# Patient Record
Sex: Female | Born: 1995 | Race: White | Hispanic: No | Marital: Single | Smoking: Never smoker
Health system: Southern US, Community
[De-identification: ages and names within clinical notes are randomized; demographics above are authoritative.]

---

## 2022-05-19 ENCOUNTER — Ambulatory Visit
Admission: EM | Admit: 2022-05-19 | Discharge: 2022-05-19 | Disposition: A | Discharge status: Home / Self Care | Payer: PRIVATE HEALTH INSURANCE | Attending: Nurse Practitioner | Admitting: Nurse Practitioner

## 2022-05-19 ENCOUNTER — Ambulatory Visit (INDEPENDENT_AMBULATORY_CARE_PROVIDER_SITE_OTHER): Payer: PRIVATE HEALTH INSURANCE

## 2022-05-19 ENCOUNTER — Other Ambulatory Visit: Payer: Self-pay

## 2022-05-19 DIAGNOSIS — R109 Unspecified abdominal pain: Secondary | ICD-10-CM

## 2022-05-19 DIAGNOSIS — R079 Chest pain, unspecified: Secondary | ICD-10-CM

## 2022-05-19 DIAGNOSIS — K219 Gastro-esophageal reflux disease without esophagitis: Secondary | ICD-10-CM

## 2022-05-19 DIAGNOSIS — R1032 Left lower quadrant pain: Secondary | ICD-10-CM

## 2022-05-19 LAB — POCT URINALYSIS DIP (MANUAL ENTRY)
Blood, UA: NEGATIVE
Glucose, UA: NEGATIVE mg/dL
Leukocytes, UA: NEGATIVE
Nitrite, UA: NEGATIVE
Spec Grav, UA: >=1.03 — AB (ref 1.010–1.025)
Urobilinogen, UA: 0.2 E.U./dL
pH, UA: 6 (ref 5.0–8.0)

## 2022-05-19 MED ORDER — ALUM & MAG HYDROXIDE-SIMETH 200-200-20 MG/5ML PO SUSP
30.0000 mL | Freq: Once | ORAL | Status: AC
Start: 2022-05-19 — End: 2022-05-19
  Administered 2022-05-19: 30 mL via ORAL

## 2022-05-19 MED ORDER — OMEPRAZOLE 20 MG PO CPDR: 20.0000 mg | DELAYED_RELEASE_CAPSULE | Freq: Every day | ORAL | 0 refills | Status: DC

## 2022-05-19 MED ORDER — LIDOCAINE VISCOUS HCL 2 % MT SOLN
15.0000 mL | Freq: Once | OROMUCOSAL | Status: AC
  Administered 2022-05-19: 15 mL via ORAL

## 2022-05-19 NOTE — Discharge Instructions (Addendum)
Your x-ray urinalysis were negative today. Recommend a watch and wait strategy to see if your symptoms worsen.  In the interim, you can take over-the-counter Tylenol to see if this helps with your abdominal pain. Recommend eating a healthy diet to include increased fruits and vegetables. Follow-up in the ER if you develop fever, chills, worsening abdominal pain, nausea, vomiting, or other concerns.

## 2022-05-19 NOTE — ED Triage Notes (Signed)
Pt states she has been having pain and swelling in her lower left abdominal area for about 3 days  Pt states she is also having some chest discomfort

## 2022-05-19 NOTE — ED Provider Notes (Signed)
[facility]    CSN: [id number] Arrival date & time: 05/19/22  1150      History   Chief Complaint Chief Complaint  Patient presents with   Abdominal Pain    Lower abdominal pain    HPI Lindsay Rogers is a 26 y.o. female.   The patient is a 26 year old female who presents with left lower quadrant abdominal pain and intermittent chest pain.  Symptoms have been present for the past 2 days.  Patient states pain comes and goes, she describes the pain as "sharp" she states the pain stops her in her tracks.  She denies fever, chills, nausea, vomiting, or diarrhea.  With regard to her chest pain, she states that pain has also been present for 2 days. LMP was on 05/02/2022. States that she does have intermittent shortness of breath with the chest pain.  Denies radiation of pain, nausea, vomiting, or diaphoresis. Patient reports history of GERD. Patient also reports she has been drinking energy drinks.    History reviewed. No pertinent past medical history.  There are no problems to display for this patient.   History reviewed. No pertinent surgical history.  OB History   No obstetric history on file.      Home Medications    Prior to Admission medications   Medication Sig Start Date End Date Taking? Authorizing Provider  omeprazole (PRILOSEC) 20 MG capsule Take 1 capsule (20 mg total) by mouth daily. 05/19/22  Yes Shion Bluestein-Warren, Sadie Haber, NP    Family History History reviewed. No pertinent family history.  Social History Social History   Tobacco Use   Smoking status: Never    Passive exposure: Never   Smokeless tobacco: Never  Vaping Use   Vaping Use: Every day   Substances: Nicotine, Flavoring  Substance Use Topics   Alcohol use: Yes   Drug use: Never     Allergies   Patient has no known allergies.   Review of Systems Review of Systems PER HPI  Physical Exam Triage Vital Signs ED Triage Vitals  Enc Vitals Group     BP 05/19/22 1204 114/73      Pulse Rate 05/19/22 1204 98     Resp 05/19/22 1204 20     Temp 05/19/22 1204 98.3 F (36.8 C)     Temp Source 05/19/22 1204 Oral     SpO2 05/19/22 1204 96 %     Weight --      Height --      Head Circumference --      Peak Flow --      Pain Score 05/19/22 1202 9     Pain Loc --      Pain Edu? --      Excl. in GC? --    No data found.  Updated Vital Signs BP 114/73 (BP Location: Right Arm)   Pulse 98   Temp 98.3 F (36.8 C) (Oral)   Resp 20   LMP 05/02/2022 (Approximate)   SpO2 96%   Visual Acuity Right Eye Distance:   Left Eye Distance:   Bilateral Distance:    Right Eye Near:   Left Eye Near:    Bilateral Near:     Physical Exam Vitals reviewed.  Constitutional:      General: She is not in acute distress.    Appearance: She is well-developed.  Cardiovascular:     Rate and Rhythm: Regular rhythm.     Heart sounds: Normal heart sounds.  Pulmonary:  Effort: Pulmonary effort is normal.     Breath sounds: Normal breath sounds.  Abdominal:     General: Bowel sounds are normal. There is no distension.     Palpations: Abdomen is soft.     Tenderness: There is abdominal tenderness in the left lower quadrant. There is no right CVA tenderness, left CVA tenderness, guarding or rebound.     Comments: Reports some relief after GI cocktail.  Genitourinary:    Vagina: Normal. No vaginal discharge.  Skin:    General: Skin is warm and dry.     Capillary Refill: Capillary refill takes less than 2 seconds.     Findings: No erythema or rash.  Neurological:     General: No focal deficit present.     Mental Status: She is alert and oriented to person, place, and time.     Cranial Nerves: No cranial nerve deficit.  Psychiatric:        Mood and Affect: Mood normal.        Behavior: Behavior normal.     UC Treatments / Results  Labs (all labs ordered are listed, but only abnormal results are displayed) Labs Reviewed  POCT URINALYSIS DIP (MANUAL ENTRY) - Abnormal;  Notable for the following components:      Result Value   Bilirubin, UA small (*)    Ketones, POC UA trace (5) (*)    Spec Grav, UA >=1.030 (*)    Protein Ur, POC trace (*)    All other components within normal limits    EKG   Radiology DG Abd 1 View  Result Date: 05/19/2022 CLINICAL DATA:  Abdominal pain EXAM: ABDOMEN - 1 VIEW COMPARISON:  None Available. FINDINGS: The bowel gas pattern is nonobstructive. Mild amount of retained fecal material in the colon. No radio-opaque calculi or other significant radiographic abnormality are seen. IMPRESSION: No acute process identified. Electronically Signed   By: Jannifer Hick M.D.   On: 05/19/2022 12:45    Procedures Procedures (including critical care time)  Medications Ordered in UC Medications  alum & mag hydroxide-simeth (MAALOX/MYLANTA) 200-200-20 MG/5ML suspension 30 mL (30 mLs Oral Given 05/19/22 1232)    And  lidocaine (XYLOCAINE) 2 % viscous mouth solution 15 mL (15 mLs Oral Given 05/19/22 1232)    Initial Impression / Assessment and Plan / UC Course  I have reviewed the triage vital signs and the nursing notes.  Pertinent labs & imaging results that were available during my care of the patient were reviewed by me and considered in my medical decision making (see chart for details).  The patient is a 26 year old female who presents with chest pain and left lower quadrant abdominal pain.  Symptoms have been present for the past 2 days.  Her exam is reassuring, along with her vital signs.  Her EKG is negative for a STEMI.  Her abdominal x-ray is also negative.  Patient advised that I cannot make a definitive determination as the cause of her symptoms based on the limited resources here in urgent care.  Patient was advised that if she has worsening of symptoms to include fever, chills, worsening abdominal pain, with nausea and vomiting, she should go to the ER.  Patient was advised that she can approach with a watch and wait strategy  to see if her symptoms improve.  In the interim, recommended over-the-counter Tylenol to see if this helps with her abdominal pain.  I have also prescribed omeprazole to help with her reflux.  Recommended supportive care  to include increasing fluids and allowing for plenty of rest.  Patient advised to follow-up as needed. Final Clinical Impressions(s) / UC Diagnoses   Final diagnoses:  Left lower quadrant abdominal pain  Gastroesophageal reflux disease, unspecified whether esophagitis present  Chest pain, unspecified type     Discharge Instructions      Your x-ray urinalysis were negative today. Recommend a watch and wait strategy to see if your symptoms worsen.  In the interim, you can take over-the-counter Tylenol to see if this helps with your abdominal pain. Recommend eating a healthy diet to include increased fruits and vegetables. Follow-up in the ER if you develop fever, chills, worsening abdominal pain, nausea, vomiting, or other concerns.     ED Prescriptions     Medication Sig Dispense Auth. Provider   omeprazole (PRILOSEC) 20 MG capsule Take 1 capsule (20 mg total) by mouth daily. 30 capsule Robertta Halfhill-Warren, Sadie Haberhristie J, NP      PDMP not reviewed this encounter.   Abran CantorLeath-Warren, Dewane Timson J, NP 05/19/22 1312

## 2022-07-03 IMAGING — DX DG ABDOMEN 1V
2 series · 2 of 2 positions shown · non-contrast
Comparison: None Available.

CLINICAL DATA: Abdominal pain

EXAM:
ABDOMEN - 1 VIEW

[abdomen supine ap (1 of 2)]
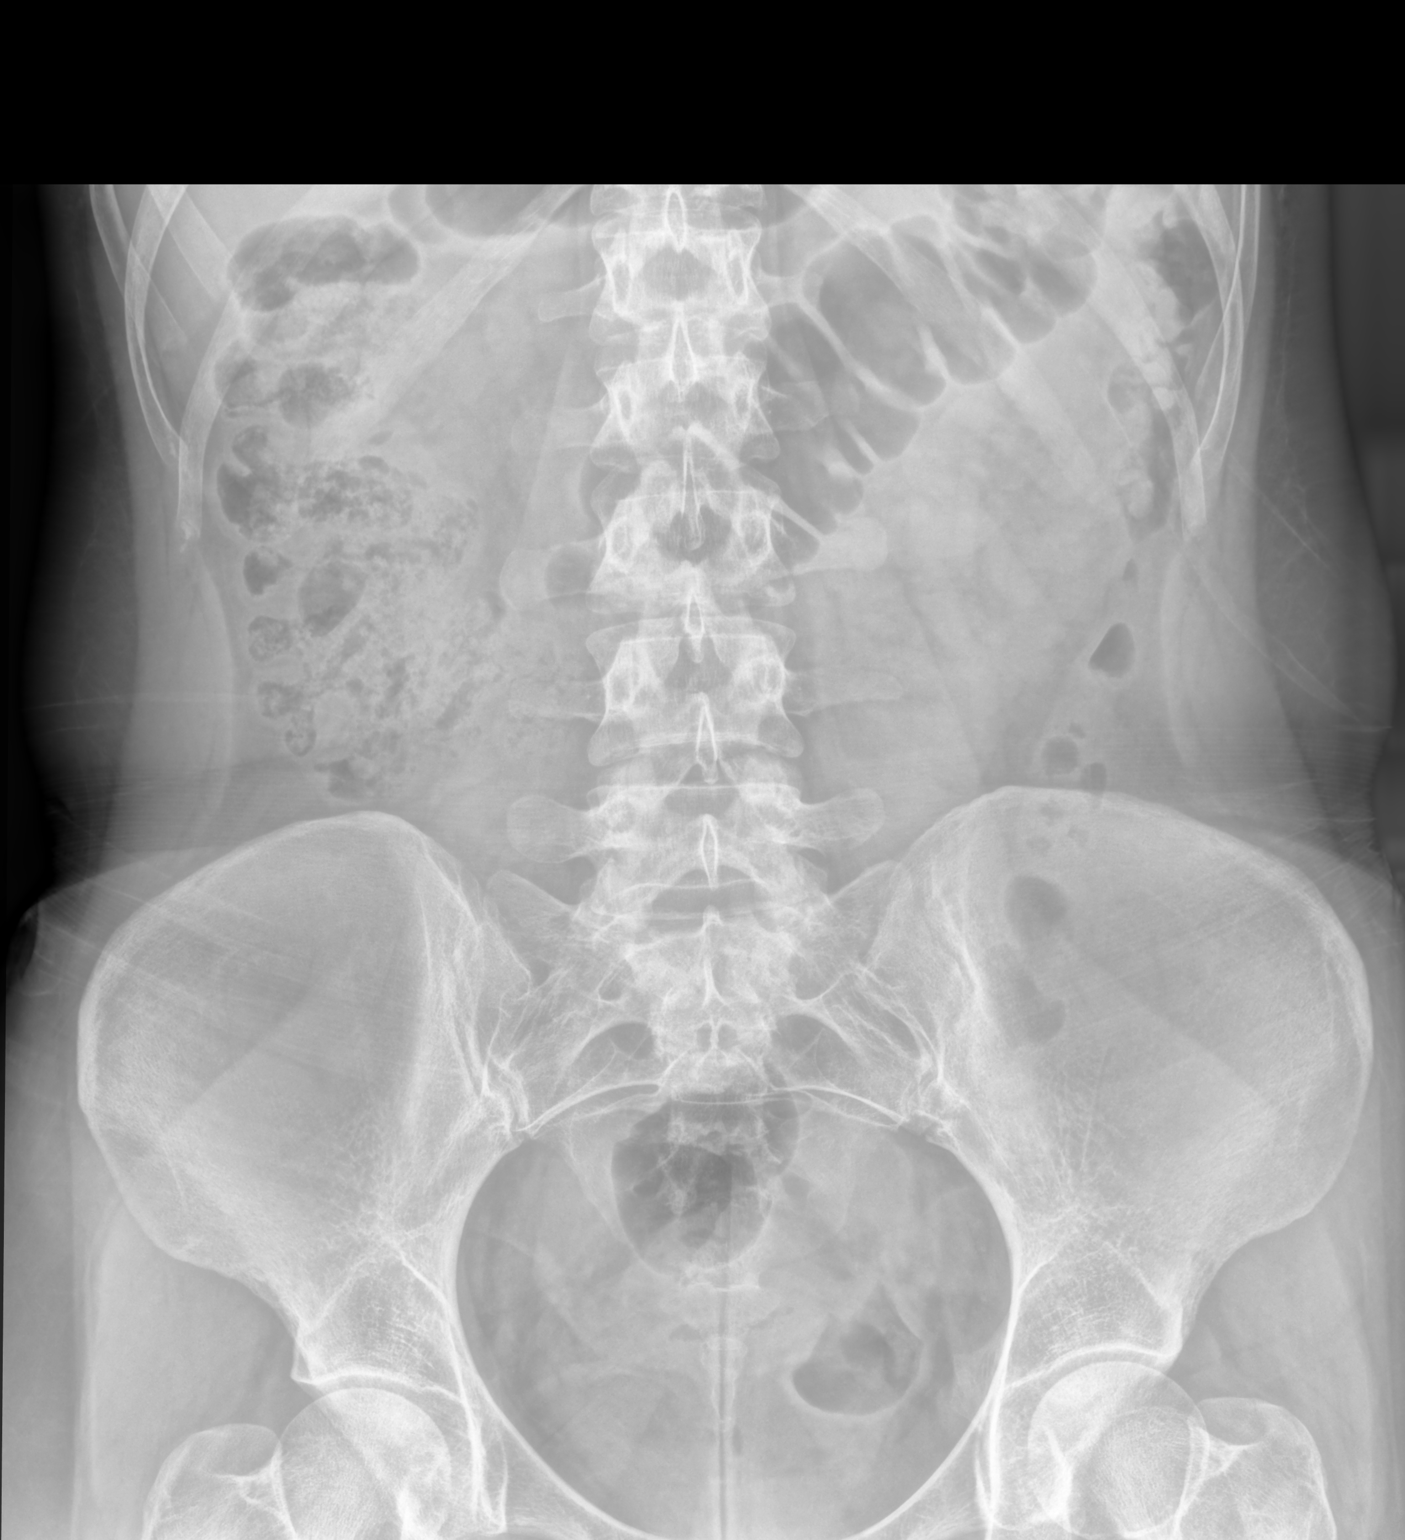

[abdomen supine ap (2 of 2)]
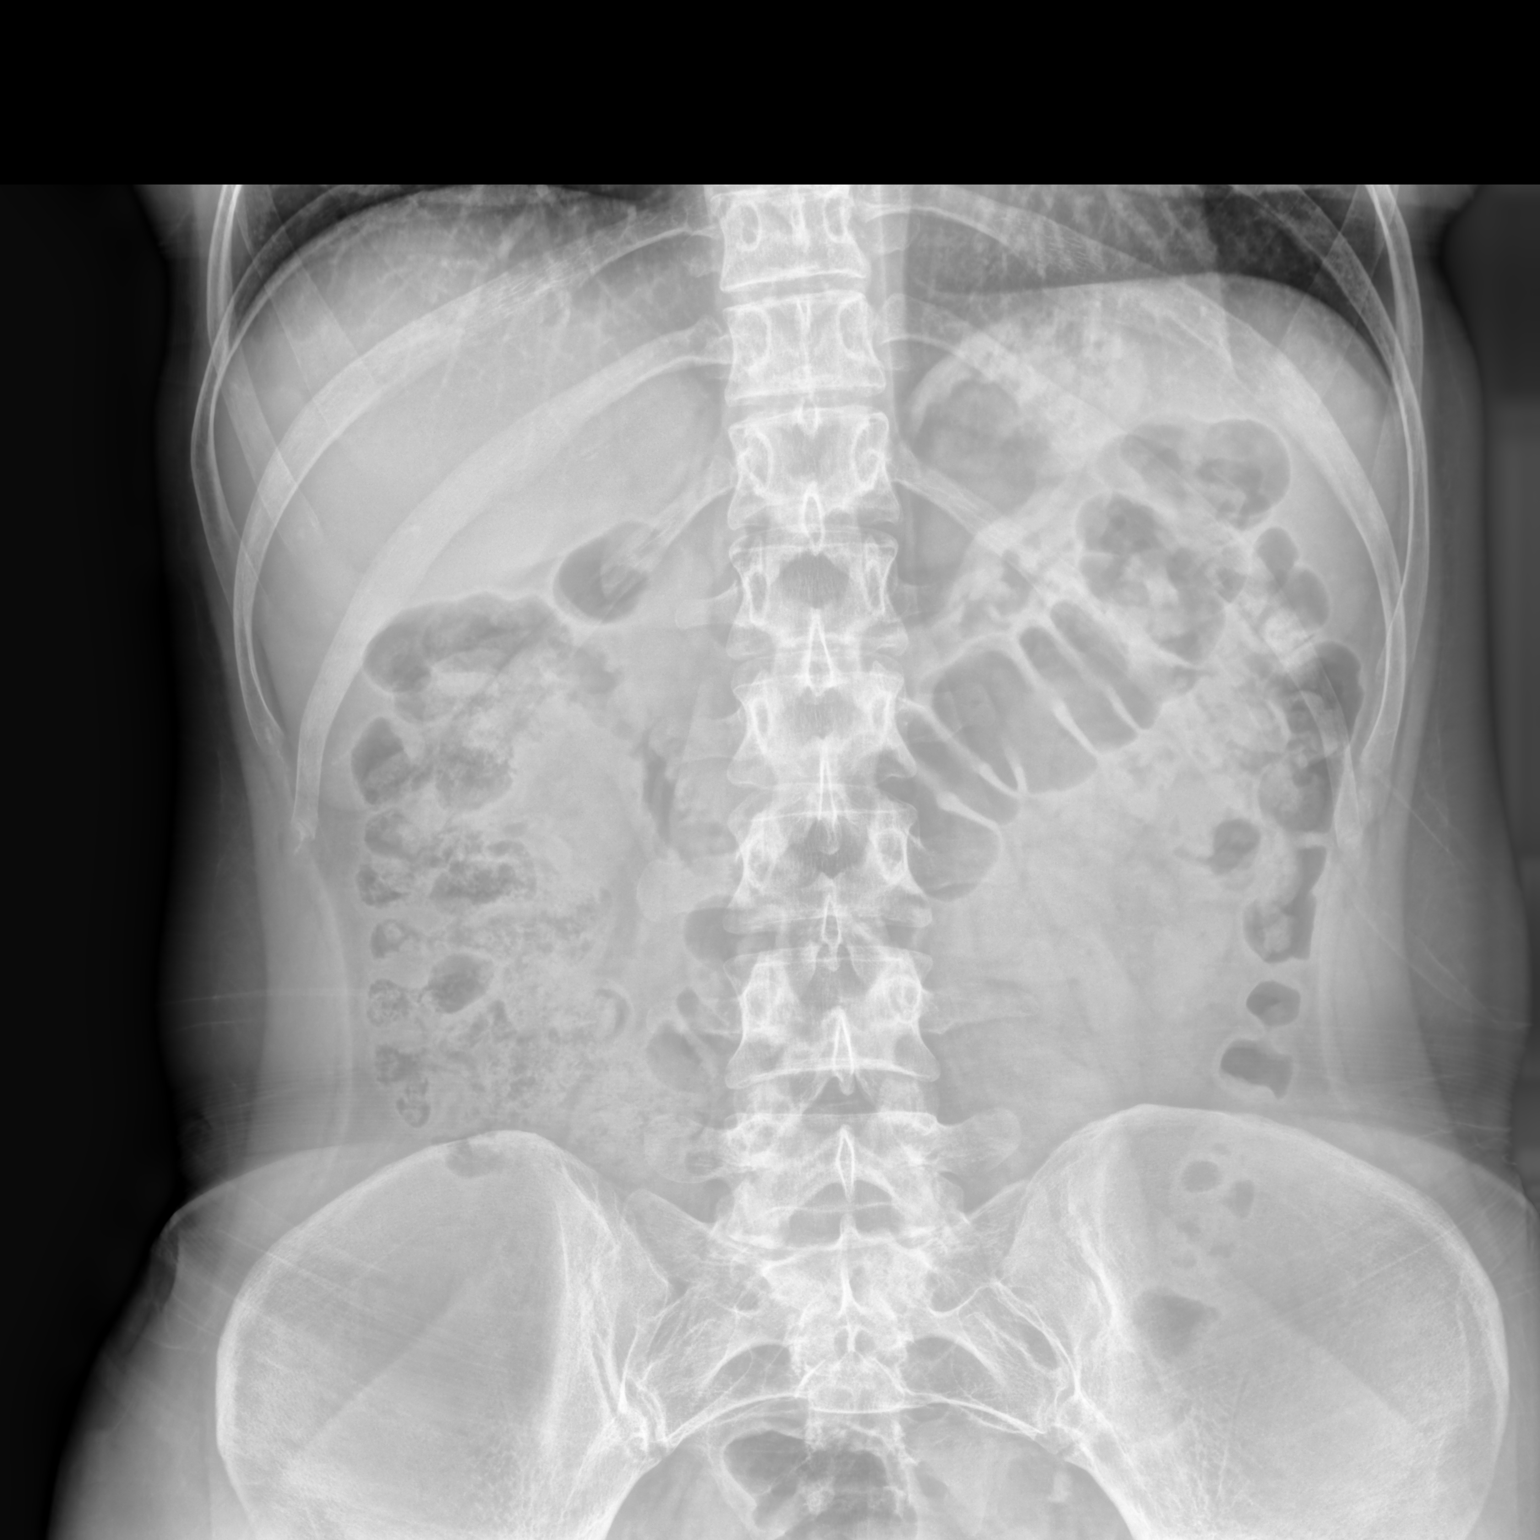

[2 of 2 positions shown; findings below may reference images not displayed]

FINDINGS: The bowel gas pattern is nonobstructive. Mild amount of retained
fecal material in the colon. No radio-opaque calculi or other
significant radiographic abnormality are seen.
IMPRESSION: No acute process identified.

## 2023-12-31 ENCOUNTER — Encounter (HOSPITAL_COMMUNITY): Payer: Self-pay | Admitting: Emergency Medicine

## 2023-12-31 ENCOUNTER — Emergency Department (HOSPITAL_COMMUNITY)
Admission: EM | Admit: 2023-12-31 | Discharge: 2023-12-31 | Disposition: A | Discharge status: Home / Self Care | Payer: No Typology Code available for payment source | Attending: Emergency Medicine | Admitting: Emergency Medicine

## 2023-12-31 ENCOUNTER — Emergency Department (HOSPITAL_COMMUNITY): Payer: No Typology Code available for payment source

## 2023-12-31 ENCOUNTER — Other Ambulatory Visit: Payer: Self-pay

## 2023-12-31 DIAGNOSIS — M25552 Pain in left hip: Secondary | ICD-10-CM | POA: Insufficient documentation

## 2023-12-31 DIAGNOSIS — Z041 Encounter for examination and observation following transport accident: Secondary | ICD-10-CM

## 2023-12-31 DIAGNOSIS — R200 Anesthesia of skin: Secondary | ICD-10-CM | POA: Diagnosis not present

## 2023-12-31 DIAGNOSIS — Y92410 Unspecified street and highway as the place of occurrence of the external cause: Secondary | ICD-10-CM | POA: Insufficient documentation

## 2023-12-31 LAB — URINALYSIS, ROUTINE W REFLEX MICROSCOPIC
Bilirubin Urine: NEGATIVE
Glucose, UA: NEGATIVE mg/dL
Hgb urine dipstick: NEGATIVE
Ketones, ur: NEGATIVE mg/dL
Leukocytes,Ua: NEGATIVE
Nitrite: NEGATIVE
Protein, ur: NEGATIVE mg/dL
Specific Gravity, Urine: 1.01 (ref 1.005–1.030)
pH: 6 (ref 5.0–8.0)

## 2023-12-31 LAB — CBC WITH DIFFERENTIAL/PLATELET
Abs Immature Granulocytes: 0.03 10*3/uL (ref 0.00–0.07)
Basophils Absolute: 0 10*3/uL (ref 0.0–0.1)
Basophils Relative: 0 %
Eosinophils Absolute: 0.1 10*3/uL (ref 0.0–0.5)
Eosinophils Relative: 1 %
HCT: 42.2 % (ref 36.0–46.0)
Hemoglobin: 14.1 g/dL (ref 12.0–15.0)
Immature Granulocytes: 0 %
Lymphocytes Relative: 17 %
Lymphs Abs: 1.5 10*3/uL (ref 0.7–4.0)
MCH: 29.6 pg (ref 26.0–34.0)
MCHC: 33.4 g/dL (ref 30.0–36.0)
MCV: 88.5 fL (ref 80.0–100.0)
Monocytes Absolute: 0.5 10*3/uL (ref 0.1–1.0)
Monocytes Relative: 6 %
Neutro Abs: 6.4 10*3/uL (ref 1.7–7.7)
Neutrophils Relative %: 76 %
Platelets: 426 10*3/uL — ABNORMAL HIGH (ref 150–400)
RBC: 4.77 MIL/uL (ref 3.87–5.11)
RDW: 12 % (ref 11.5–15.5)
WBC: 8.5 10*3/uL (ref 4.0–10.5)
nRBC: 0 % (ref 0.0–0.2)

## 2023-12-31 LAB — TYPE AND SCREEN
ABO/RH(D): A POS
Antibody Screen: NEGATIVE

## 2023-12-31 LAB — COMPREHENSIVE METABOLIC PANEL
ALT: 13 U/L (ref 0–44)
AST: 18 U/L (ref 15–41)
Albumin: 4.6 g/dL (ref 3.5–5.0)
Alkaline Phosphatase: 49 U/L (ref 38–126)
Anion gap: 9 (ref 5–15)
BUN: 8 mg/dL (ref 6–20)
CO2: 21 mmol/L — ABNORMAL LOW (ref 22–32)
Calcium: 9.4 mg/dL (ref 8.9–10.3)
Chloride: 108 mmol/L (ref 98–111)
Creatinine, Ser: 0.66 mg/dL (ref 0.44–1.00)
GFR, Estimated: >60 mL/min (ref >60)
Glucose, Bld: 105 mg/dL — ABNORMAL HIGH (ref 70–99)
Potassium: 4 mmol/L (ref 3.5–5.1)
Sodium: 138 mmol/L (ref 135–145)
Total Bilirubin: 0.8 mg/dL (ref 0.0–1.2)
Total Protein: 7.8 g/dL (ref 6.5–8.1)

## 2023-12-31 LAB — HCG, QUANTITATIVE, PREGNANCY: hCG, Beta Chain, Quant, S: <1 m[IU]/mL (ref <5)

## 2023-12-31 MED ORDER — LIDOCAINE 5 % EX PTCH: 1.0000 | MEDICATED_PATCH | CUTANEOUS | 0 refills | Status: AC

## 2023-12-31 MED ORDER — KETOROLAC TROMETHAMINE 15 MG/ML IJ SOLN
15.0000 mg | Freq: Once | INTRAMUSCULAR | Status: AC
  Administered 2023-12-31: 15 mg via INTRAVENOUS
  Filled 2023-12-31: qty 1

## 2023-12-31 MED ORDER — METHOCARBAMOL 500 MG PO TABS: 500.0000 mg | ORAL_TABLET | Freq: Four times a day (QID) | ORAL | 0 refills | Status: DC | PRN

## 2023-12-31 MED ORDER — FENTANYL CITRATE PF 50 MCG/ML IJ SOSY
50.0000 ug | PREFILLED_SYRINGE | Freq: Once | INTRAMUSCULAR | Status: AC
  Administered 2023-12-31: 50 ug via INTRAVENOUS
  Filled 2023-12-31: qty 1

## 2023-12-31 MED ORDER — IOHEXOL 300 MG/ML  SOLN
100.0000 mL | Freq: Once | INTRAMUSCULAR | Status: AC | PRN
  Administered 2023-12-31: 100 mL via INTRAVENOUS

## 2023-12-31 MED ORDER — LIDOCAINE 5 % EX PTCH
1.0000 | MEDICATED_PATCH | CUTANEOUS | Status: DC
  Administered 2023-12-31: 1 via TRANSDERMAL
  Filled 2023-12-31: qty 1

## 2023-12-31 MED ORDER — NAPROXEN 500 MG PO TABS: 500.0000 mg | ORAL_TABLET | Freq: Two times a day (BID) | ORAL | 0 refills | Status: DC

## 2023-12-31 MED ORDER — KETOROLAC TROMETHAMINE 15 MG/ML IJ SOLN: 15.0000 mg | Freq: Once | INTRAMUSCULAR | Status: DC

## 2023-12-31 MED ORDER — METHOCARBAMOL 500 MG PO TABS
500.0000 mg | ORAL_TABLET | Freq: Once | ORAL | Status: AC
  Administered 2023-12-31: 500 mg via ORAL
  Filled 2023-12-31: qty 1
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
## 2024-01-23 ENCOUNTER — Encounter: Payer: Self-pay | Admitting: Orthopedic Surgery

## 2024-01-23 ENCOUNTER — Ambulatory Visit (INDEPENDENT_AMBULATORY_CARE_PROVIDER_SITE_OTHER): Payer: Self-pay | Admitting: Orthopedic Surgery

## 2024-01-23 ENCOUNTER — Other Ambulatory Visit: Payer: Self-pay

## 2024-01-23 VITALS — Ht 65.0 in | Wt 171.0 lb

## 2024-01-23 DIAGNOSIS — M79605 Pain in left leg: Secondary | ICD-10-CM

## 2024-01-23 DIAGNOSIS — G8929 Other chronic pain: Secondary | ICD-10-CM

## 2024-01-23 DIAGNOSIS — M25562 Pain in left knee: Secondary | ICD-10-CM

## 2024-01-23 MED ORDER — PREDNISONE 10 MG (21) PO TBPK: ORAL_TABLET | ORAL | 0 refills | Status: DC
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
## 2024-01-24 ENCOUNTER — Encounter: Payer: Self-pay | Admitting: Orthopedic Surgery

## 2024-02-06 ENCOUNTER — Encounter: Payer: Self-pay | Admitting: Orthopedic Surgery

## 2024-02-21 ENCOUNTER — Encounter: Payer: Self-pay | Admitting: Orthopedic Surgery

## 2024-02-21 ENCOUNTER — Ambulatory Visit: Payer: Self-pay | Admitting: Orthopedic Surgery

## 2024-02-21 DIAGNOSIS — R200 Anesthesia of skin: Secondary | ICD-10-CM

## 2024-02-21 DIAGNOSIS — M6702 Short Achilles tendon (acquired), left ankle: Secondary | ICD-10-CM

## 2024-02-21 DIAGNOSIS — R202 Paresthesia of skin: Secondary | ICD-10-CM
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
## 2024-03-13 ENCOUNTER — Telehealth: Payer: Self-pay | Admitting: Orthopedic Surgery

## 2024-03-13 NOTE — Telephone Encounter (Signed)
 DR. Dallas Schimke   Patient called and lvm stating we referred her to [facility] and she wants to be referred to [facility] in [place].

## 2024-03-14 NOTE — Telephone Encounter (Signed)
 Information faxed to [facility].

## 2024-04-03 ENCOUNTER — Ambulatory Visit: Payer: Self-pay | Admitting: Orthopedic Surgery

## 2024-05-23 ENCOUNTER — Telehealth: Payer: Self-pay | Admitting: Orthopedic Surgery

## 2024-05-23 NOTE — Telephone Encounter (Signed)
 Returned the pt's call, lvm for her to cb.  She is wanting to schedule an appointment.

## 2024-08-01 ENCOUNTER — Other Ambulatory Visit: Payer: Self-pay

## 2024-08-01 ENCOUNTER — Encounter (HOSPITAL_COMMUNITY): Payer: Self-pay

## 2024-08-01 ENCOUNTER — Emergency Department (HOSPITAL_COMMUNITY): Admitting: Certified Registered Nurse Anesthetist

## 2024-08-01 ENCOUNTER — Encounter (HOSPITAL_COMMUNITY): Admission: EM | Disposition: A | Discharge status: Home / Self Care | Payer: Self-pay | Source: Home / Self Care

## 2024-08-01 ENCOUNTER — Observation Stay (HOSPITAL_COMMUNITY)
Admission: EM | Admit: 2024-08-01 | Discharge: 2024-08-01 | Disposition: A | Discharge status: Home / Self Care | Attending: Surgery | Admitting: Surgery

## 2024-08-01 ENCOUNTER — Emergency Department (HOSPITAL_COMMUNITY)

## 2024-08-01 DIAGNOSIS — F1090 Alcohol use, unspecified, uncomplicated: Secondary | ICD-10-CM | POA: Insufficient documentation

## 2024-08-01 DIAGNOSIS — K353 Acute appendicitis with localized peritonitis, without perforation or gangrene: Secondary | ICD-10-CM | POA: Diagnosis not present

## 2024-08-01 DIAGNOSIS — K3580 Unspecified acute appendicitis: Secondary | ICD-10-CM | POA: Diagnosis not present

## 2024-08-01 DIAGNOSIS — R109 Unspecified abdominal pain: Secondary | ICD-10-CM | POA: Diagnosis present

## 2024-08-01 HISTORY — PX: XI ROBOTIC LAPAROSCOPIC ASSISTED APPENDECTOMY: SHX6877

## 2024-08-01 LAB — CBC WITH DIFFERENTIAL/PLATELET
Abs Immature Granulocytes: 0 K/uL (ref 0.00–0.07)
Basophils Absolute: 0 K/uL (ref 0.0–0.1)
Basophils Relative: 0 %
Eosinophils Absolute: 0.1 K/uL (ref 0.0–0.5)
Eosinophils Relative: 2 %
HCT: 41.8 % (ref 36.0–46.0)
Hemoglobin: 14.2 g/dL (ref 12.0–15.0)
Immature Granulocytes: 0 %
Lymphocytes Relative: 11 %
Lymphs Abs: 0.8 K/uL (ref 0.7–4.0)
MCH: 30.3 pg (ref 26.0–34.0)
MCHC: 34 g/dL (ref 30.0–36.0)
MCV: 89.3 fL (ref 80.0–100.0)
Monocytes Absolute: 0.4 K/uL (ref 0.1–1.0)
Monocytes Relative: 6 %
Neutro Abs: 5.7 K/uL (ref 1.7–7.7)
Neutrophils Relative %: 81 %
Platelets: 257 K/uL (ref 150–400)
RBC: 4.68 MIL/uL (ref 3.87–5.11)
RDW: 12.2 % (ref 11.5–15.5)
WBC: 7 K/uL (ref 4.0–10.5)
nRBC: 0 % (ref 0.0–0.2)

## 2024-08-01 LAB — COMPREHENSIVE METABOLIC PANEL WITH GFR
ALT: 11 U/L (ref 0–44)
AST: 15 U/L (ref 15–41)
Albumin: 4.9 g/dL (ref 3.5–5.0)
Alkaline Phosphatase: 41 U/L (ref 38–126)
Anion gap: 14 (ref 5–15)
BUN: 12 mg/dL (ref 6–20)
CO2: 21 mmol/L — ABNORMAL LOW (ref 22–32)
Calcium: 9.5 mg/dL (ref 8.9–10.3)
Chloride: 102 mmol/L (ref 98–111)
Creatinine, Ser: 0.8 mg/dL (ref 0.44–1.00)
GFR, Estimated: >60 mL/min (ref >60)
Glucose, Bld: 90 mg/dL (ref 70–99)
Potassium: 3.6 mmol/L (ref 3.5–5.1)
Sodium: 137 mmol/L (ref 135–145)
Total Bilirubin: 1.5 mg/dL — ABNORMAL HIGH (ref 0.0–1.2)
Total Protein: 8.1 g/dL (ref 6.5–8.1)

## 2024-08-01 LAB — PREGNANCY, URINE: Preg Test, Ur: NEGATIVE

## 2024-08-01 LAB — URINALYSIS, ROUTINE W REFLEX MICROSCOPIC
Bilirubin Urine: NEGATIVE
Glucose, UA: NEGATIVE mg/dL
Hgb urine dipstick: NEGATIVE
Ketones, ur: NEGATIVE mg/dL
Leukocytes,Ua: NEGATIVE
Nitrite: NEGATIVE
Protein, ur: NEGATIVE mg/dL
Specific Gravity, Urine: 1.014 (ref 1.005–1.030)
pH: 6 (ref 5.0–8.0)

## 2024-08-01 LAB — POC OCCULT BLOOD, ED: Fecal Occult Bld: POSITIVE — AB

## 2024-08-01 LAB — LIPASE, BLOOD: Lipase: 30 U/L (ref 11–51)

## 2024-08-01 SURGERY — APPENDECTOMY, ROBOT-ASSISTED, LAPAROSCOPIC
Anesthesia: General | Site: Abdomen

## 2024-08-01 MED ORDER — CHLORHEXIDINE GLUCONATE 0.12 % MT SOLN
OROMUCOSAL | Status: AC
  Filled 2024-08-01: qty 15

## 2024-08-01 MED ORDER — STERILE WATER FOR IRRIGATION IR SOLN
Status: DC | PRN
  Administered 2024-08-01: 500 mL

## 2024-08-01 MED ORDER — IOHEXOL 300 MG/ML  SOLN
100.0000 mL | Freq: Once | INTRAMUSCULAR | Status: AC | PRN
  Administered 2024-08-01: 100 mL via INTRAVENOUS

## 2024-08-01 MED ORDER — OXYCODONE HCL 5 MG PO TABS: 5.0000 mg | ORAL_TABLET | Freq: Four times a day (QID) | ORAL | 0 refills | Status: AC | PRN

## 2024-08-01 MED ORDER — ONDANSETRON HCL 4 MG/2ML IJ SOLN
INTRAMUSCULAR | Status: DC | PRN
  Administered 2024-08-01: 4 mg via INTRAVENOUS

## 2024-08-01 MED ORDER — PROPOFOL 10 MG/ML IV BOLUS
INTRAVENOUS | Status: DC | PRN
  Administered 2024-08-01: 170 mg via INTRAVENOUS

## 2024-08-01 MED ORDER — DEXAMETHASONE SODIUM PHOSPHATE 10 MG/ML IJ SOLN
INTRAMUSCULAR | Status: AC
Start: 2024-08-01 — End: 2024-08-01
  Filled 2024-08-01: qty 1

## 2024-08-01 MED ORDER — BUPIVACAINE HCL (PF) 0.5 % IJ SOLN
INTRAMUSCULAR | Status: AC
  Filled 2024-08-01: qty 30

## 2024-08-01 MED ORDER — MIDAZOLAM HCL 2 MG/2ML IJ SOLN
INTRAMUSCULAR | Status: AC
  Filled 2024-08-01: qty 2

## 2024-08-01 MED ORDER — METRONIDAZOLE 500 MG/100ML IV SOLN
INTRAVENOUS | Status: AC
  Filled 2024-08-01: qty 100

## 2024-08-01 MED ORDER — CEFAZOLIN SODIUM-DEXTROSE 2-4 GM/100ML-% IV SOLN
2.0000 g | Freq: Once | INTRAVENOUS | Status: AC
  Administered 2024-08-01: 2 g via INTRAVENOUS

## 2024-08-01 MED ORDER — ONDANSETRON 4 MG PO TBDP
4.0000 mg | ORAL_TABLET | Freq: Four times a day (QID) | ORAL | Status: DC | PRN
Start: 2024-08-01 — End: 2024-08-01

## 2024-08-01 MED ORDER — BUPIVACAINE HCL (PF) 0.5 % IJ SOLN
INTRAMUSCULAR | Status: DC | PRN
  Administered 2024-08-01: 30 mL

## 2024-08-01 MED ORDER — FENTANYL CITRATE (PF) 100 MCG/2ML IJ SOLN
INTRAMUSCULAR | Status: DC | PRN
  Administered 2024-08-01 (×3): 50 ug via INTRAVENOUS
  Administered 2024-08-01: 100 ug via INTRAVENOUS

## 2024-08-01 MED ORDER — SUGAMMADEX SODIUM 200 MG/2ML IV SOLN
INTRAVENOUS | Status: AC
  Filled 2024-08-01: qty 2

## 2024-08-01 MED ORDER — ONDANSETRON HCL 4 MG/2ML IJ SOLN
INTRAMUSCULAR | Status: AC
  Filled 2024-08-01: qty 2

## 2024-08-01 MED ORDER — CHLORHEXIDINE GLUCONATE CLOTH 2 % EX PADS: 6.0000 | MEDICATED_PAD | Freq: Once | CUTANEOUS | Status: DC

## 2024-08-01 MED ORDER — SCOPOLAMINE 1 MG/3DAYS TD PT72
1.0000 | MEDICATED_PATCH | Freq: Once | TRANSDERMAL | Status: DC
  Administered 2024-08-01: 1.5 mg via TRANSDERMAL

## 2024-08-01 MED ORDER — ONDANSETRON HCL 4 MG PO TABS: 4.0000 mg | ORAL_TABLET | Freq: Every day | ORAL | 1 refills | Status: AC | PRN

## 2024-08-01 MED ORDER — ONDANSETRON HCL 4 MG/2ML IJ SOLN: 4.0000 mg | Freq: Four times a day (QID) | INTRAMUSCULAR | Status: DC | PRN

## 2024-08-01 MED ORDER — ROCURONIUM BROMIDE 10 MG/ML (PF) SYRINGE
PREFILLED_SYRINGE | INTRAVENOUS | Status: AC
  Filled 2024-08-01: qty 10

## 2024-08-01 MED ORDER — ROCURONIUM BROMIDE 10 MG/ML (PF) SYRINGE
PREFILLED_SYRINGE | INTRAVENOUS | Status: DC | PRN
  Administered 2024-08-01: 60 mg via INTRAVENOUS

## 2024-08-01 MED ORDER — DEXAMETHASONE SODIUM PHOSPHATE 4 MG/ML IJ SOLN: 8.0000 mg | Freq: Once | INTRAMUSCULAR | Status: DC | PRN

## 2024-08-01 MED ORDER — LACTATED RINGERS IV SOLN: INTRAVENOUS | Status: DC

## 2024-08-01 MED ORDER — METRONIDAZOLE 500 MG/100ML IV SOLN: 500.0000 mg | Freq: Once | INTRAVENOUS | Status: DC

## 2024-08-01 MED ORDER — OXYCODONE HCL 5 MG PO TABS
5.0000 mg | ORAL_TABLET | ORAL | Status: DC | PRN
  Administered 2024-08-01: 5 mg via ORAL
  Filled 2024-08-01: qty 2

## 2024-08-01 MED ORDER — FENTANYL CITRATE PF 50 MCG/ML IJ SOSY: 25.0000 ug | PREFILLED_SYRINGE | INTRAMUSCULAR | Status: DC | PRN

## 2024-08-01 MED ORDER — MIDAZOLAM HCL 2 MG/2ML IJ SOLN
INTRAMUSCULAR | Status: DC | PRN
  Administered 2024-08-01: 2 mg via INTRAVENOUS

## 2024-08-01 MED ORDER — SCOPOLAMINE 1 MG/3DAYS TD PT72
MEDICATED_PATCH | TRANSDERMAL | Status: AC
  Filled 2024-08-01: qty 1

## 2024-08-01 MED ORDER — MORPHINE SULFATE (PF) 2 MG/ML IV SOLN: 2.0000 mg | INTRAVENOUS | Status: DC | PRN

## 2024-08-01 MED ORDER — DEXAMETHASONE SODIUM PHOSPHATE 10 MG/ML IJ SOLN
INTRAMUSCULAR | Status: DC | PRN
  Administered 2024-08-01: 10 mg via INTRAVENOUS

## 2024-08-01 MED ORDER — KETOROLAC TROMETHAMINE 30 MG/ML IJ SOLN
INTRAMUSCULAR | Status: DC | PRN
Start: 2024-08-01 — End: 2024-08-01
  Administered 2024-08-01: 30 mg via INTRAVENOUS

## 2024-08-01 MED ORDER — ONDANSETRON HCL 4 MG/2ML IJ SOLN
4.0000 mg | Freq: Once | INTRAMUSCULAR | Status: AC | PRN
  Administered 2024-08-01: 4 mg via INTRAVENOUS
  Filled 2024-08-01: qty 2

## 2024-08-01 MED ORDER — FENTANYL CITRATE (PF) 100 MCG/2ML IJ SOLN
50.0000 ug | Freq: Once | INTRAMUSCULAR | Status: AC
  Administered 2024-08-01: 50 ug via INTRAVENOUS
  Filled 2024-08-01: qty 2

## 2024-08-01 MED ORDER — METRONIDAZOLE 500 MG/100ML IV SOLN
500.0000 mg | Freq: Once | INTRAVENOUS | Status: AC
  Administered 2024-08-01: 500 mg via INTRAVENOUS

## 2024-08-01 MED ORDER — SUGAMMADEX SODIUM 200 MG/2ML IV SOLN
INTRAVENOUS | Status: DC | PRN
  Administered 2024-08-01: 200 mg via INTRAVENOUS

## 2024-08-01 MED ORDER — LIDOCAINE 2% (20 MG/ML) 5 ML SYRINGE
INTRAMUSCULAR | Status: DC | PRN
Start: 2024-08-01 — End: 2024-08-01
  Administered 2024-08-01: 60 mg via INTRAVENOUS

## 2024-08-01 MED ORDER — FENTANYL CITRATE (PF) 250 MCG/5ML IJ SOLN
INTRAMUSCULAR | Status: AC
Start: 2024-08-01 — End: 2024-08-01
  Filled 2024-08-01: qty 5

## 2024-08-01 MED ORDER — PROPOFOL 10 MG/ML IV BOLUS
INTRAVENOUS | Status: AC
  Filled 2024-08-01: qty 20

## 2024-08-01 MED ORDER — SODIUM CHLORIDE 0.9 % IV SOLN: 2.0000 g | Freq: Once | INTRAVENOUS | Status: DC

## 2024-08-01 MED ORDER — MORPHINE SULFATE (PF) 4 MG/ML IV SOLN
4.0000 mg | Freq: Once | INTRAVENOUS | Status: AC
  Administered 2024-08-01: 4 mg via INTRAVENOUS
  Filled 2024-08-01: qty 1

## 2024-08-01 MED ORDER — CEFAZOLIN SODIUM-DEXTROSE 2-4 GM/100ML-% IV SOLN
INTRAVENOUS | Status: AC
  Filled 2024-08-01: qty 100

## 2024-08-01 MED ORDER — KETOROLAC TROMETHAMINE 30 MG/ML IJ SOLN
INTRAMUSCULAR | Status: AC
  Filled 2024-08-01: qty 1

## 2024-08-01 MED ORDER — LIDOCAINE 2% (20 MG/ML) 5 ML SYRINGE
INTRAMUSCULAR | Status: AC
  Filled 2024-08-01: qty 5

## 2024-08-01 SURGICAL SUPPLY — 36 items
COVER LIGHT HANDLE (MISCELLANEOUS) IMPLANT
COVER MAYO STAND XLG (MISCELLANEOUS) ×1 IMPLANT
DEFOGGER SCOPE WARMER CLEARIFY (MISCELLANEOUS) ×1 IMPLANT
DERMABOND ADVANCED .7 DNX12 (GAUZE/BANDAGES/DRESSINGS) ×1 IMPLANT
DRAPE ARM DVNC X/XI (DISPOSABLE) ×3 IMPLANT
DRAPE COLUMN DVNC XI (DISPOSABLE) ×1 IMPLANT
FORCEPS BPLR R/ABLATION 8 DVNC (INSTRUMENTS) ×1 IMPLANT
GLOVE BIOGEL PI IND STRL 6.5 (GLOVE) ×2 IMPLANT
GLOVE BIOGEL PI IND STRL 7.0 (GLOVE) ×3 IMPLANT
GLOVE SURG SS PI 6.5 STRL IVOR (GLOVE) ×2 IMPLANT
GOWN STRL REUS W/TWL LRG LVL3 (GOWN DISPOSABLE) ×3 IMPLANT
GRASPER SUT TROCAR 14GX15 (MISCELLANEOUS) ×1 IMPLANT
KIT PINK PAD W/HEAD ARM REST (MISCELLANEOUS) ×1 IMPLANT
KIT TURNOVER KIT A (KITS) ×1 IMPLANT
MANIFOLD NEPTUNE II (INSTRUMENTS) ×1 IMPLANT
NDL HYPO 21X1.5 SAFETY (NEEDLE) ×1 IMPLANT
NDL INSUFFLATION 14GA 120MM (NEEDLE) ×1 IMPLANT
NEEDLE HYPO 21X1.5 SAFETY (NEEDLE) ×1 IMPLANT
NEEDLE INSUFFLATION 14GA 120MM (NEEDLE) ×1 IMPLANT
OBTURATOR OPTICALSTD 8 DVNC (TROCAR) ×1 IMPLANT
PACK LAP CHOLE LZT030E (CUSTOM PROCEDURE TRAY) ×1 IMPLANT
PENCIL HANDSWITCHING (ELECTRODE) ×1 IMPLANT
RELOAD STAPLE 30 2.5 WHT DVNC (STAPLE) ×1 IMPLANT
SEAL UNIV 5-12 XI (MISCELLANEOUS) ×4 IMPLANT
SEALER VESSEL EXT DVNC XI (MISCELLANEOUS) ×1 IMPLANT
SET BASIN LINEN APH (SET/KITS/TRAYS/PACK) ×1 IMPLANT
SET TUBE SMOKE EVAC HIGH FLOW (TUBING) ×1 IMPLANT
STAPLER 30 CRVD 8 SUREFORM (STAPLE) ×1 IMPLANT
SUT MNCRL AB 4-0 PS2 18 (SUTURE) ×1 IMPLANT
SUT SILK 2-0 18XBRD TIE 12 (SUTURE) ×1 IMPLANT
SUT VICRYL 0 UR6 27IN ABS (SUTURE) ×1 IMPLANT
SYR 30ML LL (SYRINGE) ×1 IMPLANT
SYSTEM RETRIEVL 5MM INZII UNIV (BASKET) ×1 IMPLANT
TAPE TRANSPORE STRL 2 31045 (GAUZE/BANDAGES/DRESSINGS) ×1 IMPLANT
TRAY FOLEY W/BAG SLVR 16FR ST (SET/KITS/TRAYS/PACK) ×1 IMPLANT
WATER STERILE IRR 500ML POUR (IV SOLUTION) ×1 IMPLANT

## 2024-08-01 NOTE — Progress Notes (Signed)
 [facility]  Spoke with the patient's family in the consultation room.  I explained that she tolerated the procedure without difficulty.  She has dissolvable stitches under the skin with overlying skin glue.  This will flake off in 10 to 14 days.  We will see how she does postoperatively.  If she tolerates a diet without nausea and vomiting, and pain is well-controlled, she will be stable for discharge home today.  Otherwise we will plan for discharge home tomorrow.  At the time of discharge, she will be discharged home with a prescription for narcotic pain medication that they should take as needed for pain.  I also want her taking scheduled Tylenol.  If they take the narcotic pain medication, they should take a stool softener as well.  The patient will follow-up with me in 2 weeks for phone follow-up.  All questions were answered to their expressed satisfaction.  Plan: -Place in observation in a MedSurg bed -Regular diet -PRN pain control and antiemetics -No need for antibiotics postoperatively -Will see how patient is doing later today.  If she tolerates a diet without nausea and vomiting, and her pain is well-controlled, will plan for discharge home today.  Otherwise, we will plan for discharge home tomorrow -CBC tomorrow AM if patient still in hospital -SCDs ordered  Dorothyann Brittle, DO [facility] [address] [address] [place], [place] [address] [phone] (office)

## 2024-08-01 NOTE — Anesthesia Preprocedure Evaluation (Addendum)
 Anesthesia Evaluation  Patient identified by MRN, date of birth, ID band Patient awake    Reviewed: Allergy & Precautions, H&P , NPO status , Patient's Chart, lab work & pertinent test results  Airway Mallampati: I  TM Distance: >3 FB Neck ROM: Full    Dental no notable dental hx.    Pulmonary neg pulmonary ROS   Pulmonary exam normal breath sounds clear to auscultation       Cardiovascular negative cardio ROS Normal cardiovascular exam Rhythm:Regular Rate:Normal     Neuro/Psych negative neurological ROS  negative psych ROS   GI/Hepatic negative GI ROS, Neg liver ROS,,,  Endo/Other  negative endocrine ROS    Renal/GU negative Renal ROS  negative genitourinary   Musculoskeletal negative musculoskeletal ROS (+)    Abdominal   Peds negative pediatric ROS (+)  Hematology negative hematology ROS (+)   Anesthesia Other Findings   Reproductive/Obstetrics negative OB ROS                              Anesthesia Physical Anesthesia Plan  ASA: 1 and emergent  Anesthesia Plan: General   Post-op Pain Management:    Induction: Rapid sequence and Intravenous  PONV Risk Score and Plan: Scopolamine  patch - Pre-op  Airway Management Planned: Oral ETT  Additional Equipment:   Intra-op Plan:   Post-operative Plan: Extubation in OR  Informed Consent: I have reviewed the patients History and Physical, chart, labs and discussed the procedure including the risks, benefits and alternatives for the proposed anesthesia with the patient or authorized representative who has indicated his/her understanding and acceptance.     Dental advisory given  Plan Discussed with: CRNA  Anesthesia Plan Comments:         Anesthesia Quick Evaluation

## 2024-08-01 NOTE — H&P (Signed)
 [facility] History and Physical  Reason for Referral: Early acute appendicitis Referring Physician: Sherran Barks, PA-C  Chief Complaint   Abdominal Pain     Lindsay Rogers is a 28 y.o. female.  HPI: Patient presents for a 1 day history of periumbilical pain that has since radiated into her right lower quadrant.  She states that starting yesterday she had periumbilical pain that was very severe and sharp.  It started moving into her right lower quadrant last night, and has failed to improve this morning.  She had nausea without any associated emesis.  She also notes that she had some bloody bowel movements this morning.  She has no significant past medical history and is not currently taking any medications.  She denies history of abdominal surgeries.  In the ED, she was noted to be hemodynamically stable.  Her blood work was within normal limits.  She underwent a CT of the abdomen and pelvis which demonstrated concern for possible early acute appendicitis.  History reviewed. No pertinent past medical history.  History reviewed. No pertinent surgical history.  History reviewed. No pertinent family history.  Social History   Tobacco Use   Smoking status: Never    Passive exposure: Never   Smokeless tobacco: Never  Vaping Use   Vaping status: Every Day   Substances: Nicotine, Flavoring  Substance Use Topics   Alcohol use: Yes   Drug use: Never    Medications: I have reviewed the patient's current medications. Allergies as of 08/01/2024       Reactions   Red Dye Shortness Of Breath, Swelling, Other (See Comments)   Difficulty breathing      ROS:  Pertinent items are noted in HPI.  Blood pressure 115/71, pulse 72, temperature 98.1 F (36.7 C), temperature source Oral, resp. rate 18, height 5' 5 (1.651 m), weight 73.5 kg, last menstrual period 07/24/2024, SpO2 97%. Physical Exam Vitals reviewed.  Constitutional:      Appearance: She is well-developed.   HENT:     Head: Normocephalic and atraumatic.  Eyes:     Extraocular Movements: Extraocular movements intact.     Pupils: Pupils are equal, round, and reactive to light.  Cardiovascular:     Rate and Rhythm: Normal rate.  Pulmonary:     Effort: Pulmonary effort is normal.  Abdominal:     Comments: Abdomen soft, nondistended, no percussion tenderness, tenderness to palpation in the right lower quadrant; no rigidity, guarding, rebound tenderness; positive McBurney sign  Skin:    General: Skin is warm and dry.  Neurological:     General: No focal deficit present.     Mental Status: She is alert and oriented to person, place, and time.  Psychiatric:        Mood and Affect: Mood normal.        Behavior: Behavior normal.     Results: Results for orders placed or performed during the hospital encounter of 08/01/24 (from the past 48 hours)  Lipase, blood     Status: None   Collection Time: 08/01/24  9:20 AM  Result Value Ref Range   Lipase 30 11 - 51 U/L    Comment: Performed at [facility], [address], [place], [place] [address]  Comprehensive metabolic panel     Status: Abnormal   Collection Time: 08/01/24  9:20 AM  Result Value Ref Range   Sodium 137 135 - 145 mmol/L   Potassium 3.6 3.5 - 5.1 mmol/L   Chloride 102 98 -  111 mmol/L   CO2 21 (L) 22 - 32 mmol/L   Glucose, Bld 90 70 - 99 mg/dL    Comment: Glucose reference range applies only to samples taken after fasting for at least 8 hours.   BUN 12 6 - 20 mg/dL   Creatinine, Ser 9.19 0.44 - 1.00 mg/dL   Calcium 9.5 8.9 - 89.6 mg/dL   Total Protein 8.1 6.5 - 8.1 g/dL   Albumin 4.9 3.5 - 5.0 g/dL   AST 15 15 - 41 U/L   ALT 11 0 - 44 U/L   Alkaline Phosphatase 41 38 - 126 U/L   Total Bilirubin 1.5 (H) 0.0 - 1.2 mg/dL   GFR, Estimated >39 >39 mL/min    Comment: (NOTE) Calculated using the CKD-EPI Creatinine Equation (2021)    Anion gap 14 5 - 15    Comment: Performed at [facility], [address] [place], [place]  [address]  Urinalysis, Routine w reflex microscopic -Urine, Clean Catch     Status: None   Collection Time: 08/01/24  9:20 AM  Result Value Ref Range   Color, Urine YELLOW YELLOW   APPearance CLEAR CLEAR   Specific Gravity, Urine 1.014 1.005 - 1.030   pH 6.0 5.0 - 8.0   Glucose, UA NEGATIVE NEGATIVE mg/dL   Hgb urine dipstick NEGATIVE NEGATIVE   Bilirubin Urine NEGATIVE NEGATIVE   Ketones, ur NEGATIVE NEGATIVE mg/dL   Protein, ur NEGATIVE NEGATIVE mg/dL   Nitrite NEGATIVE NEGATIVE   Leukocytes,Ua NEGATIVE NEGATIVE    Comment: Performed at [facility], [address] [place], [place] [address]  CBC with Differential     Status: None   Collection Time: 08/01/24  9:20 AM  Result Value Ref Range   WBC 7.0 4.0 - 10.5 K/uL   RBC 4.68 3.87 - 5.11 MIL/uL   Hemoglobin 14.2 12.0 - 15.0 g/dL   HCT 58.1 63.9 - 53.9 %   MCV 89.3 80.0 - 100.0 fL   MCH 30.3 26.0 - 34.0 pg   MCHC 34.0 30.0 - 36.0 g/dL   RDW 87.7 88.4 - 84.4 %   Platelets 257 150 - 400 K/uL   nRBC 0.0 0.0 - 0.2 %   Neutrophils Relative % 81 %   Neutro Abs 5.7 1.7 - 7.7 K/uL   Lymphocytes Relative 11 %   Lymphs Abs 0.8 0.7 - 4.0 K/uL   Monocytes Relative 6 %   Monocytes Absolute 0.4 0.1 - 1.0 K/uL   Eosinophils Relative 2 %   Eosinophils Absolute 0.1 0.0 - 0.5 K/uL   Basophils Relative 0 %   Basophils Absolute 0.0 0.0 - 0.1 K/uL   Immature Granulocytes 0 %   Abs Immature Granulocytes 0.00 0.00 - 0.07 K/uL    Comment: Performed at [facility], [address], [place], [place] [address]  POC occult blood, ED     Status: Abnormal   Collection Time: 08/01/24  9:24 AM  Result Value Ref Range   Fecal Occult Bld POSITIVE (A) NEGATIVE  Pregnancy, urine     Status: None   Collection Time: 08/01/24  9:32 AM  Result Value Ref Range   Preg Test, Ur NEGATIVE NEGATIVE    Comment:        THE SENSITIVITY OF THIS METHODOLOGY IS >20 mIU/mL. Performed at [facility], [address] [place], [place] [address]     CT ABDOMEN PELVIS W  CONTRAST Result Date: 08/01/2024 EXAM: CT ABDOMEN AND PELVIS WITH CONTRAST 08/01/2024 10:51:29 AM TECHNIQUE: CT  of the abdomen and pelvis was performed with the administration of intravenous contrast. Multiplanar reformatted images are provided for review. Automated exposure control, iterative reconstruction, and/or weight based adjustment of the mA/kV was utilized to reduce the radiation dose to as low as reasonably achievable. COMPARISON: None available. CLINICAL HISTORY: RLQ abdominal pain. c/o RUQ, RLQ abdominal pain that began last night. Pt reports hematochezia since last night as well. FINDINGS: LOWER CHEST: No acute abnormality. LIVER: The liver is unremarkable. GALLBLADDER AND BILE DUCTS: Gallbladder is unremarkable. No biliary ductal dilatation. SPLEEN: No acute abnormality. PANCREAS: No acute abnormality. ADRENAL GLANDS: No acute abnormality. KIDNEYS, URETERS AND BLADDER: No stones in the kidneys or ureters. No hydronephrosis. No perinephric or periureteral stranding. Urinary bladder is unremarkable. GI AND BOWEL: Slightly increased size of the appendix now measuring 7 mm, previously 5 mm Previously noted intraluminal gas within the body of the appendix is no longer seen. Submucosal hypodensity involving the ascending colon, likely sequela of prior infection/inflammation. Mild dilation of the terminal ileum containing fecalized intraluminal contents which can be seen in the setting of delayed transit PERITONEUM AND RETROPERITONEUM: Trace pelvic free fluid appears mildly hyperattenuating. Nonspecific mild mesenteric stranding involving the lower abdomen. VASCULATURE: Aorta is normal in caliber. LYMPH NODES: No lymphadenopathy. REPRODUCTIVE ORGANS: Crenulated right adnexal structure, likely corpus luteum. BONES AND SOFT TISSUES: No acute osseous abnormality. No focal soft tissue abnormality. IMPRESSION: 1. Findings of the appendix may reflect early acute appendicitis. 2. Submucosal hypodensity involving the  ascending colon, likely sequela of prior infection/inflammation. 3. Nonspecific mild mesenteric stranding involving the lower abdomen. 4. Trace pelvic mildly hyperattenuating free fluid, which may represent hemoperitoneum. Electronically signed by: Manford Cummins MD 08/01/2024 12:01 PM EDT RP Workstation: [id number]     Assessment & Plan:  Lindsay Rogers is a 28 y.o. female who presents with a 1 day history of right lower quadrant abdominal pain.  Imaging concerning for early acute appendicitis.  Imaging and blood work evaluated by myself.  -We discussed the patient's presentation is very consistent with appendicitis.  While her imaging is not super impressive, the findings with her abdominal examination lead me to believe that she has early acute appendicitis. -Discussed the risk of laparoscopic appendectomy and the option of antibiotics alone. Discussed that in [place] and some trials in the [place] , antibiotics are used for simple appendicitis. Discussed that research shows a 40% failure rate for antibiotics alone.  Discussed risk of surgery including but not limited to bleeding, infection, injury to other organs, normal appendix, and after this discussion the patient has decided to proceed with surgery. -NPO -IVF -PRN pain control and antiemetics -Plan for surgery today -Rocephin and Flagyl  preoperatively -Discussed with the patient and her fianc that she potentially could go home today versus tomorrow depending on how she does after surgery.  If her pain is well-controlled and she tolerates a diet without nausea and vomiting, we will plan for discharge home today.  Otherwise we will plan for her to go home tomorrow -Will admit to observation for now - Further recommendations to follow surgery  All questions were answered to the satisfaction of the patient and family.  Note: Portions of this report may have been transcribed using voice recognition software. Every effort has been made to ensure accuracy;  however, inadvertent computerized transcription errors may still be present.   Dorothyann Brittle, DO [facility] [address] [place], [place] [address] [phone] (office)

## 2024-08-01 NOTE — ED Provider Notes (Signed)
 [place] EMERGENCY DEPARTMENT AT [facility] Provider Note   CSN: [id number] Arrival date & time: 08/01/24  9162     Patient presents with: Abdominal Pain   Lindsay Rogers is a 28 y.o. female. No significant PMH.  Presents the ER for evaluation of right lower quadrant abdominal pain that started last night as periumbilical pain has migrated to the right lower quadrant today.  She states is painful to move, she having nausea but no vomiting.  She states she thought perhaps she had gas or constipation so she had tried to have a bowel movement, passed a small amount of stool and when she wiped she had some dark blood with wiping.  No history of hemorrhoids or anal fissures.  Denies rectal pain trauma.  She denies any urinary symptoms, LMP was approximately 1 week ago.     Abdominal Pain      Prior to Admission medications   Medication Sig Start Date End Date Taking? Authorizing Provider  ibuprofen (ADVIL) 200 MG tablet Take 400 mg by mouth daily as needed for headache. Patient not taking: Reported on 01/23/2024    [provider]  lidocaine  (LIDODERM ) 5 % Place 1 patch onto the skin daily. Remove & Discard patch within 12 hours or as directed by MD 12/31/23   Suellen Cantor A, PA-C  methocarbamol  (ROBAXIN ) 500 MG tablet Take 1 tablet (500 mg total) by mouth every 6 (six) hours as needed for muscle spasms. Patient not taking: Reported on 01/23/2024 12/31/23   Suellen Cantor A, PA-C  naproxen  (NAPROSYN ) 500 MG tablet Take 1 tablet (500 mg total) by mouth 2 (two) times daily. Patient not taking: Reported on 01/23/2024 12/31/23   Suellen Cantor LABOR, PA-C  predniSONE  (STERAPRED UNI-PAK 21 TAB) 10 MG (21) TBPK tablet 10 mg DS 12 as directed 01/23/24   Onesimo Oneil LABOR, MD    Allergies: Red dye    Review of Systems  Gastrointestinal:  Positive for abdominal pain.    Updated Vital Signs BP 126/87   Pulse 90   Temp 98.1 F (36.7 C) (Oral)   Resp 18   Ht 5' 5 (1.651 m)    Wt 73.5 kg   LMP 07/24/2024 (Exact Date)   SpO2 100%   BMI 26.96 kg/m   Physical Exam Vitals and nursing note reviewed.  Constitutional:      General: She is not in acute distress.    Appearance: She is well-developed.  HENT:     Head: Normocephalic and atraumatic.  Eyes:     Conjunctiva/sclera: Conjunctivae normal.  Cardiovascular:     Rate and Rhythm: Normal rate and regular rhythm.     Heart sounds: No murmur heard. Pulmonary:     Effort: Pulmonary effort is normal. No respiratory distress.     Breath sounds: Normal breath sounds.  Abdominal:     Palpations: Abdomen is soft.     Tenderness: There is abdominal tenderness in the right lower quadrant. Positive signs include Rovsing's sign and McBurney's sign.  Genitourinary:    Rectum: Guaiac result positive. No mass or tenderness.  Musculoskeletal:        General: No swelling.     Cervical back: Neck supple.  Skin:    General: Skin is warm and dry.     Capillary Refill: Capillary refill takes less than 2 seconds.  Neurological:     General: No focal deficit present.     Mental Status: She is alert and oriented to person, place, and  time.  Psychiatric:        Mood and Affect: Mood normal.     (all labs ordered are listed, but only abnormal results are displayed) Labs Reviewed  LIPASE, BLOOD  COMPREHENSIVE METABOLIC PANEL WITH GFR  URINALYSIS, ROUTINE W REFLEX MICROSCOPIC  CBC WITH DIFFERENTIAL/PLATELET  POC URINE PREG, ED    EKG: None  Radiology: No results found.   Procedures   Medications Ordered in the ED  ondansetron  (ZOFRAN ) injection 4 mg (has no administration in time range)                                    Medical Decision Making This patient presents to the ED for concern of right lower quadrant abdominal pain for the started last night, this involves an extensive number of treatment options, and is a complaint that carries with it a high risk of complications and morbidity.  The  differential diagnosis includes appendicitis, ovarian torsion, ovarian cyst, enteric adenitis, epiploic appendagitis, kidney stone, PID, ectopic pregnancy, other   Co morbidities that complicate the patient evaluation :   None   Additional history obtained:  Additional history obtained from EMR External records from outside source obtained and reviewed including prior notes   Lab Tests:  I Ordered, and personally interpreted labs.  The pertinent results include: CBC CMP lipase all normal urine pregnancy negative, UA normal   Imaging Studies ordered:  I ordered imaging studies including CT abdomen pelvis which shows early acute appendicitis I independently visualized and interpreted imaging within scope of identifying emergent findings  I agree with the radiologist interpretation      Consultations Obtained:  I requested consultation with the general surgeon, Dr. Evonnie,  and discussed lab and imaging findings as well as pertinent plan - they recommend: At this time no antibiotics, could be GYN source.  She will come and examine the patient and talk to her to determine if they want to do surgery.   Problem List / ED Course / Critical interventions / Medication management  Acute appendicitis-patient presents with right lower quadrant pain that started as periumbilical pain yesterday and has exam concerning for appendicitis.  CT shows slightly enlarged appendix from last CT scan and possibly very small hemoperitoneum.  No obvious ovarian cyst on imaging.  She has been very uncomfortable, additionally had some improvement with morphine  but had pain or pressure and was given fentanyl .  Discussed with surgery as above.  Dr. Tobie you did come examine the patient and is taking the patient to the OR now and is going to put orders in for antibiotics. I ordered medication including morphine   for pain  Reevaluation of the patient after these medicines showed that the patient improved I  have reviewed the patients home medicines and have made adjustments as needed   Amount and/or Complexity of Data Reviewed Labs: ordered. Radiology: ordered.  Risk Prescription drug management. Decision regarding hospitalization.        Final diagnoses:  None    ED Discharge Orders     None          Suellen Sherran LABOR, PA-C 08/01/24 1312    Kammerer, Megan L, DO 08/01/24 1616

## 2024-08-01 NOTE — Plan of Care (Signed)

## 2024-08-01 NOTE — Anesthesia Postprocedure Evaluation (Signed)
 Anesthesia Post Note  Patient: Lindsay Rogers  Procedure(s) Performed: APPENDECTOMY, ROBOT-ASSISTED, LAPAROSCOPIC (Abdomen)  Patient location during evaluation: PACU Anesthesia Type: General Level of consciousness: awake and alert Pain management: pain level controlled Vital Signs Assessment: post-procedure vital signs reviewed and stable Respiratory status: spontaneous breathing, nonlabored ventilation, respiratory function stable and patient connected to nasal cannula oxygen Cardiovascular status: blood pressure returned to baseline and stable Postop Assessment: no apparent nausea or vomiting Anesthetic complications: no   No notable events documented.   Last Vitals:  Vitals:   08/01/24 1555 08/01/24 1555  BP: 116/69 116/69  Pulse: 67 67  Resp:  14  Temp:  36.4 C  SpO2:      Last Pain:  Vitals:   08/01/24 1623  TempSrc:   PainSc: 8                  Andrea Limes

## 2024-08-01 NOTE — Anesthesia Procedure Notes (Signed)
 Procedure Name: Intubation Date/Time: 08/01/2024 1:50 PM  Performed by: Elaine Delon CROME, CRNAPre-anesthesia Checklist: Patient identified, Emergency Drugs available, Suction available and Patient being monitored Patient Re-evaluated:Patient Re-evaluated prior to induction Oxygen Delivery Method: Circle system utilized Preoxygenation: Pre-oxygenation with 100% oxygen Induction Type: IV induction Ventilation: Mask ventilation without difficulty Laryngoscope Size: Mac and 3 Grade View: Grade I Tube type: Oral Tube size: 7.0 mm Number of attempts: 1 Airway Equipment and Method: Stylet Placement Confirmation: ETT inserted through vocal cords under direct vision, positive ETCO2 and breath sounds checked- equal and bilateral Secured at: 21 cm Tube secured with: Tape Dental Injury: Teeth and Oropharynx as per pre-operative assessment

## 2024-08-01 NOTE — Discharge Summary (Signed)
 Physician Discharge Summary  Patient ID: Lindsay Rogers MRN: [id number] DOB/AGE: 26/07/1996 26 y.o.  Admit date: 08/01/2024 Discharge date: 08/01/2024  Admission Diagnoses: acute appendicitis  Discharge Diagnoses:  Principal Problem:   Acute appendicitis   Discharged Condition: stable  Hospital Course: Patient is a 28 year old female who presented with a 1 day history of right lower quadrant abdominal pain.  CT was concerning for early acute appendicitis, so decision was made to proceed with surgery.  She underwent a robotic assisted laparoscopic appendectomy today.  Postoperatively, she is tolerating a diet without nausea and vomiting, and her pain is well-controlled.  She is stable for discharge at this time.  She will be discharged home with prescriptions for oxycodone  and Zofran .  I will have a phone follow-up with the patient in 3 weeks.  Consults: None  Significant Diagnostic Studies: radiology: CT scan: Early acute appendicitis  Treatments: IV hydration, antibiotics: Ancef  and metronidazole , and surgery: Robotic assisted laparoscopic appendectomy  Discharge Exam: Blood pressure 116/69, pulse 67, temperature 97.6 F (36.4 C), temperature source Oral, resp. rate 14, height 5' 5 (1.651 m), weight 74 kg, last menstrual period 07/24/2024, SpO2 100%. General appearance: alert, cooperative, and no distress GI: Abdomen soft, nondistended, no percussion tenderness, mild incisional tenderness to palpation; no rigidity, guarding, rebound tenderness; laparoscopic incision sites C/D/I with Dermabond in place  Disposition: Discharge disposition: 01-Home or Self Care       Discharge Instructions     Call MD for:  persistant nausea and vomiting   Complete by: As directed    Call MD for:  redness, tenderness, or signs of infection (pain, swelling, redness, odor or green/yellow discharge around incision site)   Complete by: As directed    Call MD for:  severe uncontrolled pain   Complete  by: As directed    Call MD for:  temperature >100.4   Complete by: As directed    Diet - low sodium heart healthy   Complete by: As directed    Increase activity slowly   Complete by: As directed       Allergies as of 08/01/2024       Reactions   Red Dye Shortness Of Breath, Swelling, Other (See Comments)   Difficulty breathing        Medication List     TAKE these medications    lidocaine  5 % Commonly known as: Lidoderm  Place 1 patch onto the skin daily. Remove & Discard patch within 12 hours or as directed by MD   ondansetron  4 MG tablet Commonly known as: Zofran  Take 1 tablet (4 mg total) by mouth daily as needed for nausea or vomiting.   oxyCODONE  5 MG immediate release tablet Commonly known as: Roxicodone  Take 1 tablet (5 mg total) by mouth every 6 (six) hours as needed.        Follow-up Information     Lindsay Rogers, Lindsay LABOR, DO. Call.   Specialty: General Surgery Why: I will call you for a phone follow up in 3 weeks Contact information: [address] [place] [facility] [address] [phone]                 Signed: DOROTHYANN A Mills Rogers 08/01/2024, 5:17 PM

## 2024-08-01 NOTE — Discharge Instructions (Signed)
 Ambulatory Surgery Discharge Instructions  General Anesthesia or Sedation Do not drive or operate heavy machinery for 24 hours.  Do not consume alcohol, tranquilizers, sleeping medications, or any non-prescribed medications for 24 hours. Do not make important decisions or sign any important papers in the next 24 hours. You should have someone with you tonight at home.  Activity  You are advised to go directly home from the hospital.  Restrict your activities and rest for a day.  Resume light activity tomorrow. No heavy lifting over 10 lbs or strenuous exercise.  Fluids and Diet Begin with clear liquids, bouillon, dry toast, soda crackers.  If not nauseated, you may go to a regular diet when you desire.  Greasy and spicy foods are not advised.  Medications  If you have not had a bowel movement in 24 hours, take 2 tablespoons over the counter Milk of mag.             You May resume your blood thinners tomorrow (Aspirin, coumadin, or other).  You are being discharged with prescriptions for Opioid/Narcotic Medications: There are some specific considerations for these medications that you should know. Opioid Meds have risks & benefits. Addiction to these meds is always a concern with prolonged use Take medication only as directed Do not drive while taking narcotic pain medication Do not crush tablets or capsules Do not use a different container than medication was dispensed in Lock the container of medication in a cool, dry place out of reach of children and pets. Opioid medication can cause addiction Do not share with anyone else (this is a felony) Do not store medications for future use. Dispose of them properly.     Disposal:  Find a [employer] household drug take back site near you.  If you can't get to a drug take back site, use the recipe below as a last resort to dispose of expired, unused or unwanted drugs. Disposal  (Do not dispose chemotherapy drugs this way, talk to your  prescribing doctor instead.) Step 1: Mix drugs (do not crush) with dirt, kitty litter, or used coffee grounds and add a small amount of water to dissolve any solid medications. Step 2: Seal drugs in plastic bag. Step 3: Place plastic bag in trash. Step 4: Take prescription container and scratch out personal information, then recycle or throw away.  Operative Site  You have a liquid bandage over your incisions, this will begin to flake off in about a week. 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 Keep wound clean and dry. No baths or swimming. No lifting more than 10 pounds.  Contact Information: If you have questions or concerns, please call our office, [phone], Monday- Thursday 8AM-5PM and Friday 8AM-12Noon.  If it is after hours or on the weekend, please call [facility] Main Number, [phone], and ask to speak to the surgeon on call for Dr. Robyne Peers at [facility].   SPECIFIC COMPLICATIONS TO WATCH FOR: Inability to urinate Fever over 101? F by mouth Nausea and vomiting lasting longer than 24 hours. Pain not relieved by medication ordered Swelling around the operative site Increased redness, warmth, hardness, around operative area Numbness, tingling, or cold fingers or toes Blood -soaked dressing, (small amounts of oozing may be normal) Increasing and progressive drainage from surgical area or exam site

## 2024-08-01 NOTE — ED Triage Notes (Signed)
 Pt arrived via POV c/o RUQ, RLQ abdominal pain that began last night. Pt reports hematochezia since last night as well.

## 2024-08-01 NOTE — Transfer of Care (Signed)
 Immediate Anesthesia Transfer of Care Note  Patient: Lindsay Rogers  Procedure(s) Performed: APPENDECTOMY, ROBOT-ASSISTED, LAPAROSCOPIC (Abdomen)  Patient Location: PACU  Anesthesia Type:General  Level of Consciousness: drowsy and patient cooperative  Airway & Oxygen Therapy: Patient Spontanous Breathing and Patient connected to nasal cannula oxygen  Post-op Assessment: Report given to RN and Post -op Vital signs reviewed and stable  Post vital signs: Reviewed and stable  Last Vitals:  Vitals Value Taken Time  BP 125/70 08/01/24 15:15  Temp 36.7 C 08/01/24 15:11  Pulse 75 08/01/24 15:17  Resp 13 08/01/24 15:17  SpO2 100 % 08/01/24 15:17  Vitals shown include unfiled device data.  Last Pain:  Vitals:   08/01/24 1233  TempSrc: Oral  PainSc:          Complications: No notable events documented.

## 2024-08-01 NOTE — Op Note (Signed)
 [facility] Operative Note  Preoperative diagnosis: Acute appendicitis  Postoperative diagnosis: Same  Procedure: Robotic assisted laparoscopic appendectomy.  Anesthesia: General   Surgeon: Dorothyann Brittle, DO  Wound Classification: Contaminated  Specimen: Appendix  Complications: None  Estimated Blood Loss: Minimal  Indications: Patient is a 28 y.o. female  presented with a 1 day history of right lower quadrant abdominal pain.  She was noted to have early appendicitis on CT scan. The risk of surgery were explained to the patient including but not limited to bleeding, infection, finding a rupture, injury to other organs, needing to do an open procedure.   FIndings: Inflamed appendix Upon entering the abdomen (organ space), I encountered a phlegmon involving the appendix.  Description of procedure: The patient was placed on the operating table in the supine position, left arm tucked. General anesthesia was induced. A time-out was completed verifying correct patient, procedure, site, positioning, and implant(s) and/or special equipment prior to beginning this procedure. The abdomen was prepped and draped in the usual sterile fashion.  A time-out was completed verifying correct patient, procedure, site, positioning, and implant(s) and/or special equipment prior to beginning this procedure.  A supraumbilical incision was made and Veress needle was inserted.  After confirming intraabdominal location with positive saline drop test and low insufflation pressures, gas insufflation was initiated until the abdominal pressure was measured at 15 mmHg.  Afterwards, the Veress needle was removed and a 8 mm port was placed through the supraumbilical incision site using Optiview technique. No injuries were noted.  Two additional incisions were made 8 cm apart along the left side of the abdominal wall from the initial incision.  8 mm ports were then placed under direct  visualization.  The table was placed in the Trendelenburg position with the right side elevated.  Xi robotic platform was then brought to the operative field and docked. A vessel sealer was placed through the upper 8 mm port and a forced bipolar through the lower 8 mm port.   Upon entering the abdomen (organ space), I encountered a phlegmon involving the appendix. An appendix, that appeared inflamed, was identified and elevated.  The mesoappendix was taken down with vessel sealer.  A white load linear cutting stapler was used to divide and staple the base of the appendix. No bleeding from the staple line noted.  The appendix was placed in an endoscopic retrieval bag and removed through the supraumbilical 8 mm port.   The appendiceal stump staple line and mesoappendix were examined again and hemostasis noted. No other pathology was identified within pelvis. The supraumbilical trocar was removed and port site closed with PMI using 0 vicryl under direct vision. Remaining trocars were removed. No bleeding was noted.  The abdomen was allowed to collapse. Marcaine  was instilled at the incision sites.  All skin incisions then closed with subcuticular sutures Monocryl 4-0.  Wounds then dressed with dermabond.  The patient tolerated the procedure well, awakened from anesthesia and was taken to the postanesthesia care unit in satisfactory condition.  Sponge count and instrument count correct at the end of the procedure.  Dorothyann Brittle, DO [facility] [address] [place], [place] [address] [phone] (office)

## 2024-08-02 ENCOUNTER — Encounter (HOSPITAL_COMMUNITY): Payer: Self-pay | Admitting: Surgery

## 2024-08-05 ENCOUNTER — Telehealth: Payer: Self-pay | Admitting: *Deleted

## 2024-08-05 LAB — SURGICAL PATHOLOGY

## 2024-08-05 NOTE — Telephone Encounter (Signed)
 Surgical Date: 08/01/2024 Procedure: XI Robotic Assisted Laparoscopic Appendectomy   Received call from patient [phone]~ telephone.  Patient inquired about her return to work. 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   Dr. Evonnie recommends return to work on 08/19/2024.  Letter transcribed and sent to patient via MyChart.

## 2024-08-21 ENCOUNTER — Ambulatory Visit (INDEPENDENT_AMBULATORY_CARE_PROVIDER_SITE_OTHER): Admitting: Surgery

## 2024-08-21 DIAGNOSIS — Z09 Encounter for follow-up examination after completed treatment for conditions other than malignant neoplasm: Secondary | ICD-10-CM

## 2024-08-21 NOTE — Progress Notes (Signed)
 [facility]  I am calling the patient for post operative evaluation. This is not a billable encounter as it is under the global charges for the surgery.  The patient had a robotic assisted laparoscopic appendectomy on 8/7. The patient reports that she is doing well. She is tolerating a diet, having good pain control, and having regular Bms.  The incisions are healing well.  The glue is still present- advised that she can use antibiotic ointment prior to showering to help get off the remaining glue. The patient has no concerns.   Pathology: A. APPENDIX, APPENDECTOMY:  - Acute appendicitis with serositis.   Will see the patient PRN.   Dorothyann Brittle, DO [facility] [address] [place], [place] [address] [phone] (office)
